# Patient Record
Sex: Female | Born: 1995 | Race: White | Hispanic: No | Marital: Single | Smoking: Never smoker
Health system: Southern US, Community
[De-identification: ages and names within clinical notes are randomized; demographics above are authoritative.]

## PROBLEM LIST (undated history)

## (undated) DIAGNOSIS — A6000 Herpesviral infection of urogenital system, unspecified: Secondary | ICD-10-CM

## (undated) HISTORY — DX: Herpesviral infection of urogenital system, unspecified: A60.00

## (undated) HISTORY — PX: NO PAST SURGERIES: SHX2092

---
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
## 2014-04-20 ENCOUNTER — Ambulatory Visit: Payer: Self-pay | Admitting: General Practice

## 2016-04-30 ENCOUNTER — Other Ambulatory Visit: Payer: Self-pay | Admitting: Obstetrics and Gynecology

## 2016-04-30 NOTE — Telephone Encounter (Signed)
Please sched. appt and send back to [facility].  Thanks.

## 2016-04-30 NOTE — Telephone Encounter (Signed)
Pt needs to sched annual and will RF till appt. RN to communicate this with pt.

## 2016-05-22 NOTE — Telephone Encounter (Signed)
msg left

## 2016-09-03 ENCOUNTER — Encounter: Payer: Self-pay | Admitting: Obstetrics and Gynecology

## 2016-09-20 IMAGING — CR RIGHT ANKLE - COMPLETE 3+ VIEW
1 series · 3 of 3 positions shown · non-contrast
Comparison: None.

CLINICAL DATA: Jamming injury. Pain and swelling . Initial
evaluation.

EXAM:
RIGHT ANKLE - COMPLETE 3+ VIEW

[Series 1: dxr ankle right complete · 0.14mm/px · 3 of 3 slices shown]
[im 1/3]
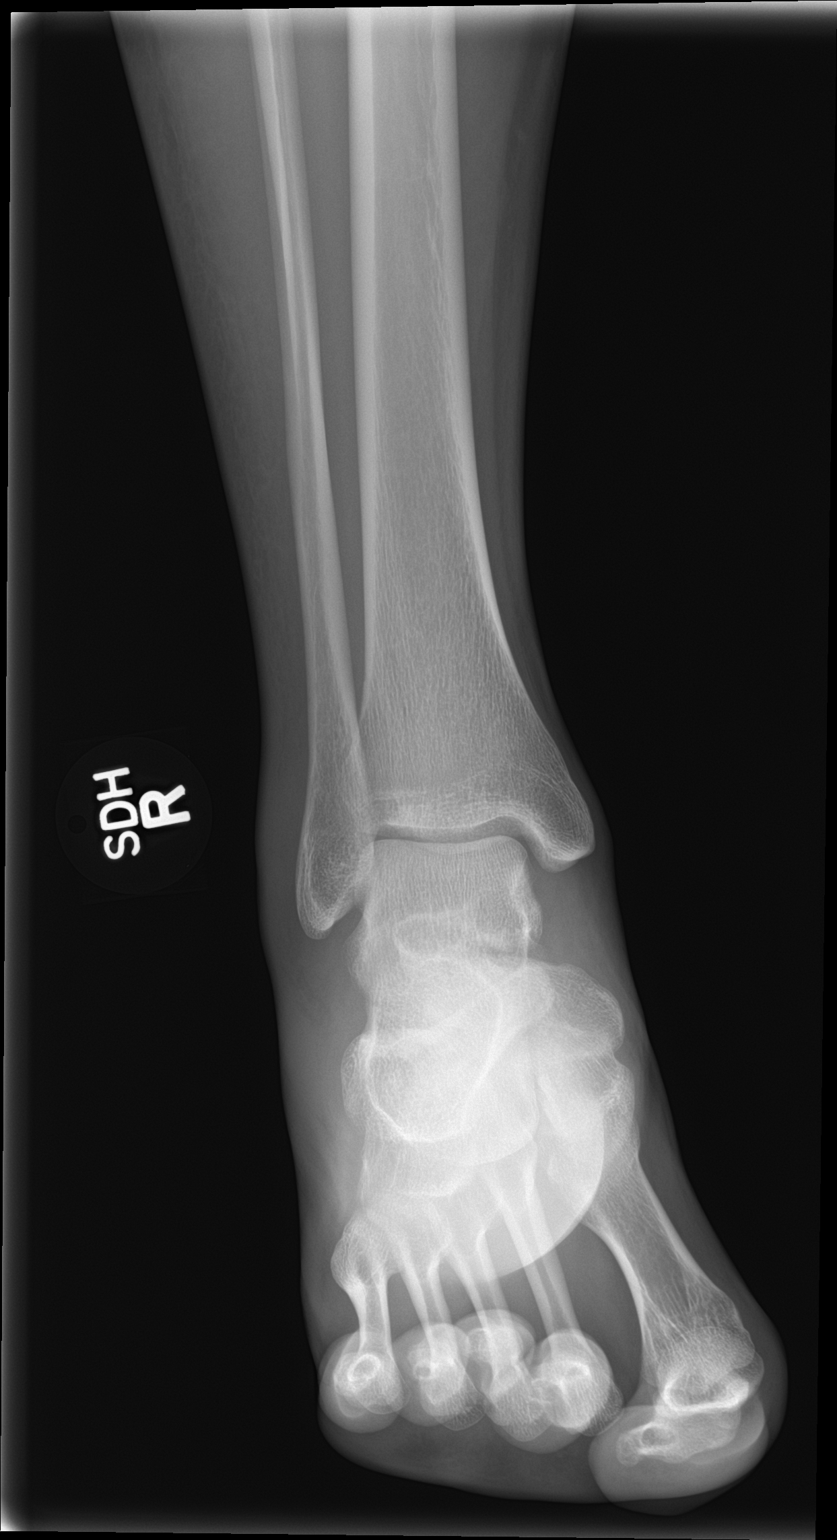
[im 2/3]
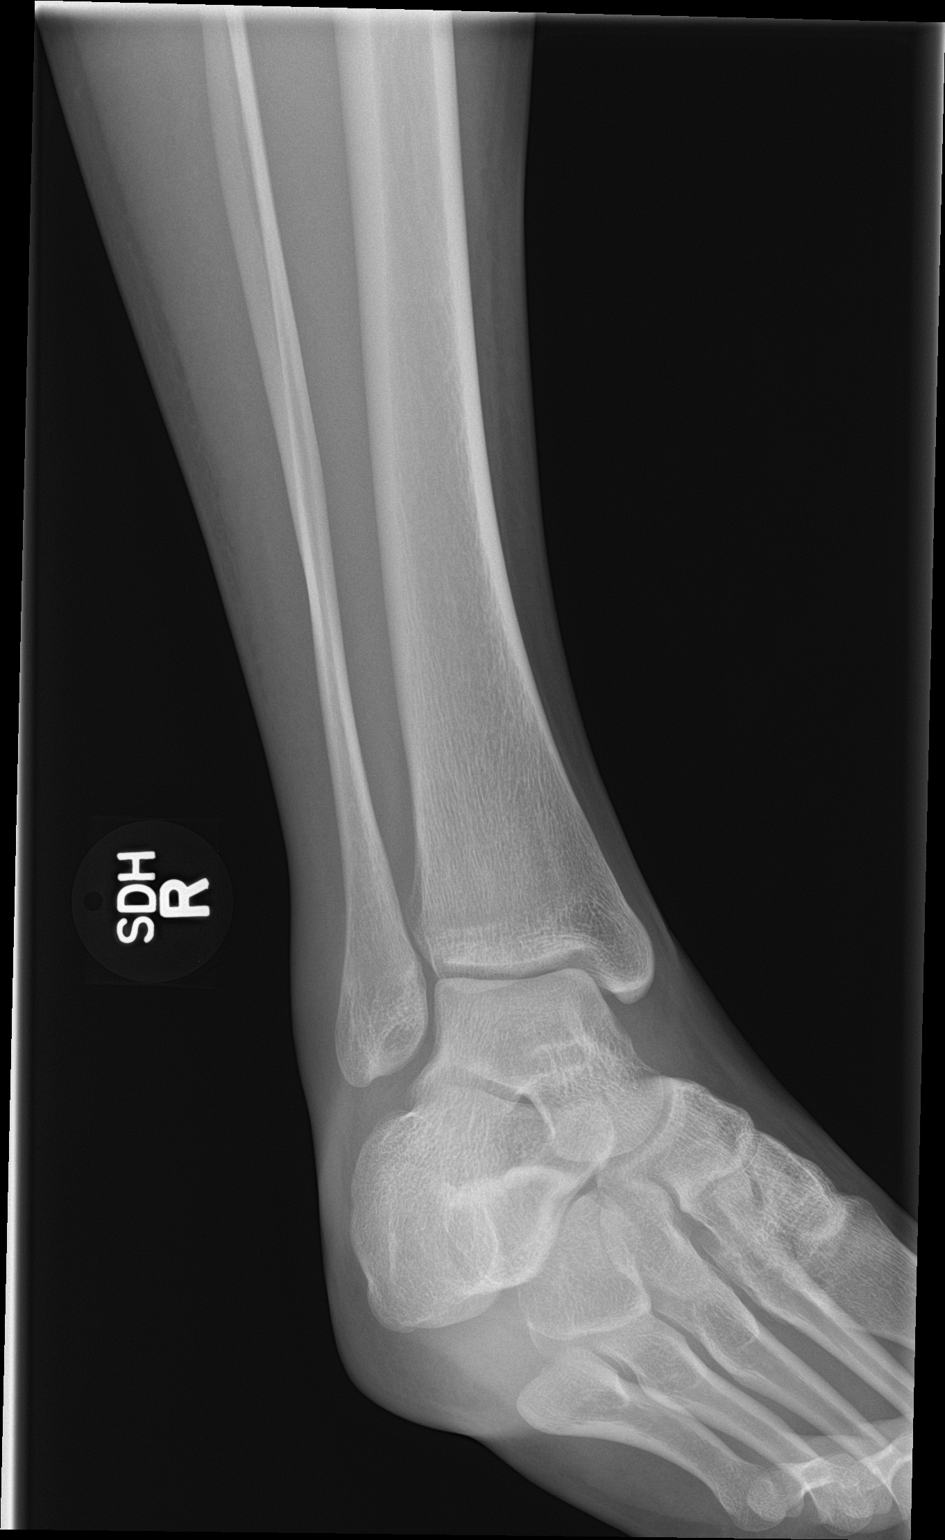
[im 3/3]
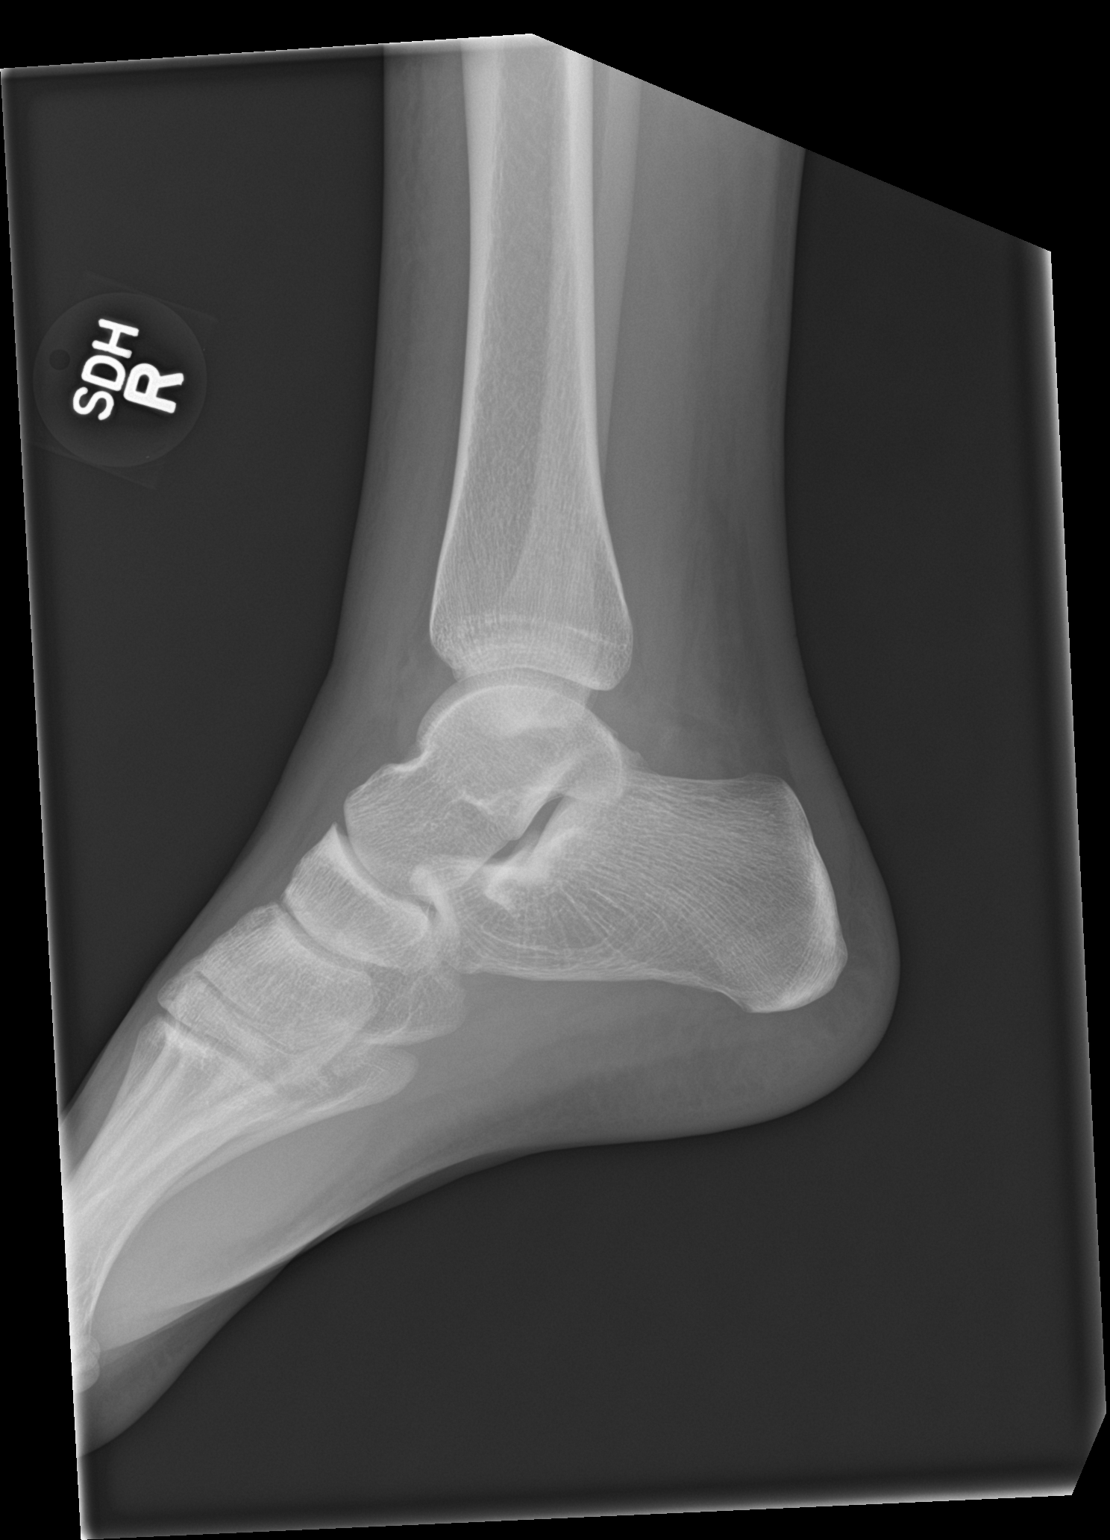

[3 of 3 positions shown; findings below may reference images not displayed]

FINDINGS: Soft tissue swelling is noted over the lateral malleolus. No
evidence of fracture or dislocation. No acute bony abnormality
identified.
IMPRESSION: Soft tissue swelling noted over the lateral malleolus. No acute bony
abnormality.

## 2016-10-09 ENCOUNTER — Telehealth: Payer: Self-pay

## 2016-10-09 MED ORDER — VALACYCLOVIR HCL 500 MG PO TABS: 500.0000 mg | ORAL_TABLET | Freq: Every day | ORAL | 0 refills | Status: DC

## 2016-10-09 NOTE — Telephone Encounter (Signed)
Pt aware.

## 2016-10-09 NOTE — Telephone Encounter (Signed)
[name] , [facility] [address] [name] is returning [facility]

## 2016-10-09 NOTE — Telephone Encounter (Signed)
Pt calling for refill of rx.  She also requested it online c no response.  Has appt Wed.  [phone].  Left detailed msg to call c name of medication and verify pharm.  We had responded to email for same information.

## 2016-10-14 ENCOUNTER — Encounter: Payer: Self-pay | Admitting: Obstetrics and Gynecology

## 2016-10-14 ENCOUNTER — Ambulatory Visit (INDEPENDENT_AMBULATORY_CARE_PROVIDER_SITE_OTHER): Payer: BLUE CROSS/BLUE SHIELD | Admitting: Obstetrics and Gynecology

## 2016-10-14 VITALS — BP 98/58 | HR 71 | Ht 68.0 in | Wt 137.0 lb

## 2016-10-14 DIAGNOSIS — A6004 Herpesviral vulvovaginitis: Secondary | ICD-10-CM | POA: Diagnosis not present

## 2016-10-14 DIAGNOSIS — Z113 Encounter for screening for infections with a predominantly sexual mode of transmission: Secondary | ICD-10-CM | POA: Diagnosis not present

## 2016-10-14 DIAGNOSIS — Z01419 Encounter for gynecological examination (general) (routine) without abnormal findings: Secondary | ICD-10-CM

## 2016-10-14 DIAGNOSIS — Z124 Encounter for screening for malignant neoplasm of cervix: Secondary | ICD-10-CM

## 2016-10-14 MED ORDER — VALACYCLOVIR HCL 500 MG PO TABS: 500.0000 mg | ORAL_TABLET | Freq: Every day | ORAL | 3 refills | Status: DC

## 2016-10-14 NOTE — Progress Notes (Addendum)
PCP:  Patient, No Pcp Per   Chief Complaint  Patient presents with  . Gynecologic Exam     HPI:      Rachael Sanders is a 21 y.o. G0P0000 who LMP was Patient's last menstrual period was 09/29/2016 (exact date)., presents today for her annual examination.  Her menses are regular every 28-30 days, lasting 5 days.  Dysmenorrhea mild, occurring first 1-2 days of flow. She does not have intermenstrual bleeding.  Sex activity: infrequently; single partner, contraception - condoms most of the time.  Declines other BC. Hx of STDs: HSV; takes valtrex daily   There is a FH of breast cancer in her MGM, genetic testing not indicated. There is no FH of ovarian cancer. The patient does not do self-breast exams.  Tobacco use: The patient denies current or previous tobacco use. Alcohol use: none No drug use.  Exercise: moderately active  She does get adequate calcium and Vitamin D in her diet.  Since her last annual GYN exam, she has not had any significant changes in her health history.   Gardasil series not done and pt not interested.    Past Medical History:  Diagnosis Date  . 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 NO PAST SURGERIES      Family History  Problem Relation Age of Onset  . Breast cancer Maternal Grandmother 80  . Cancer Neg Hx   . Diabetes Neg Hx   . Hyperlipidemia Neg Hx   . Hypertension Neg Hx   . Stroke Neg Hx   . Thyroid disease Neg Hx     Social History   Social History  . Marital status: Single    Spouse name: N/A  . Number of children: N/A  . Years of education: N/A   Occupational History  . Not on file.   Social History Main Topics  . Smoking status: Never Smoker  . Smokeless tobacco: Never Used  . Alcohol use No  . Drug use: No  . Sexual activity: Yes    Birth control/ protection: Condom   Other Topics Concern  . Not on file   Social History Narrative  . No  narrative on file    Current Meds  Medication Sig  . valACYclovir (VALTREX) 500 MG tablet Take 1 tablet (500 mg total) by mouth daily.  . [DISCONTINUED] valACYclovir (VALTREX) 500 MG tablet Take 1 tablet (500 mg total) by mouth daily.     ROS:  Review of Systems  Constitutional: Negative for fatigue, fever and unexpected weight change.  Respiratory: Negative for cough, shortness of breath and wheezing.   Cardiovascular: Negative for chest pain, palpitations and leg swelling.  Gastrointestinal: Negative for blood in stool, constipation, diarrhea, nausea and vomiting.  Endocrine: Negative for cold intolerance, heat intolerance and polyuria.  Genitourinary: Negative for dyspareunia, dysuria, flank pain, frequency, genital sores, hematuria, menstrual problem, pelvic pain, urgency, vaginal bleeding, vaginal discharge and vaginal pain.  Musculoskeletal: Negative for back pain, joint swelling and myalgias.  Skin: Negative for rash.  Neurological: Negative for dizziness, syncope, light-headedness, numbness and headaches.  Hematological: Negative for adenopathy.  Psychiatric/Behavioral: Negative for agitation, confusion, sleep disturbance and suicidal ideas. The patient is not nervous/anxious.      Objective: BP (!) 98/58   Pulse 71   Ht 5\' 8"  (1.727 m)   Wt 137 lb (62.1 kg)   LMP 09/29/2016 (Exact Date)   BMI  20.83 kg/m    Physical Exam  Constitutional: She is oriented to person, place, and time. She appears well-developed and well-nourished.  Genitourinary: Vagina normal and uterus normal. There is no rash or tenderness on the right labia. There is no rash or tenderness on the left labia. No erythema or tenderness in the vagina. No vaginal discharge found. Right adnexum does not display mass and does not display tenderness. Left adnexum does not display mass and does not display tenderness. Cervix does not exhibit motion tenderness or polyp. Uterus is not enlarged or tender.  Neck:  Normal range of motion. No thyromegaly present.  Cardiovascular: Normal rate, regular rhythm and normal heart sounds.   No murmur heard. Pulmonary/Chest: Effort normal and breath sounds normal. Right breast exhibits no mass, no nipple discharge, no skin change and no tenderness. Left breast exhibits no mass, no nipple discharge, no skin change and no tenderness.  Abdominal: Soft. There is no tenderness. There is no guarding.  Musculoskeletal: Normal range of motion.  Neurological: She is alert and oriented to person, place, and time. No cranial nerve deficit.  Psychiatric: She has a normal mood and affect. Her behavior is normal.  Vitals reviewed.   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  - Plan: valACYclovir (VALTREX) 500 MG tablet  Meds ordered this encounter  Medications  . valACYclovir (VALTREX) 500 MG tablet    Sig: Take 1 tablet (500 mg total) by mouth daily.    Dispense:  90 tablet    Refill:  3             GYN counsel adequate intake of calcium and vitamin D, diet and exercise     F/U  Return in about 1 year (around 10/14/2017).  Alicia B. Copland, PA-C 10/14/2016 10:28 AM

## 2016-10-16 LAB — IGP,CTNGTV,RFX APTIMA HPV ASCU
Chlamydia, Nuc. Acid Amp: NEGATIVE
Gonococcus, Nuc. Acid Amp: NEGATIVE
PAP SMEAR COMMENT: 0
Trich vag by NAA: NEGATIVE

## 2017-05-17 ENCOUNTER — Ambulatory Visit: Payer: BLUE CROSS/BLUE SHIELD | Admitting: Medical

## 2017-05-17 VITALS — BP 131/82 | HR 78 | Temp 99.1°F | Resp 16 | Ht 68.0 in | Wt 143.0 lb

## 2017-05-17 DIAGNOSIS — B349 Viral infection, unspecified: Secondary | ICD-10-CM

## 2017-05-17 LAB — POCT INFLUENZA A/B
Influenza A, POC: NEGATIVE
Influenza B, POC: NEGATIVE

## 2017-05-17 NOTE — Progress Notes (Signed)
   Subjective:    Patient ID: Rachael Sanders, female    DOB: 12/12/1995, 22 y.o.   MRN: [id number]  HPI 22 yo female in non acute distress started Saturday with nausea with temp 99.0, went to the gym at  2 pm, symptoms began about 7pm. Yesterday with clear nasal drainage weakness and did not "feel well at all" Temp of 99.1 and went to work, she works as a Conservation officer, naturecashier at [employer].  Tingling in Left hand on and off yesterday, patient is left handed.  Today temp of  99.2 took Advil 400mg  at  9:35am. Feels fine most of the day but feels hot.  Tingling in hand now resolved.  Aleve  Denies any chance she could be pregnant. Did not get flu vaccine Review of Systems  Constitutional: Positive for chills and fever.  HENT: Positive for congestion (little bit) and rhinorrhea (little bit). Negative for ear pain, postnasal drip, sinus pressure, sinus pain, sneezing, sore throat and tinnitus.   Eyes: Negative for discharge and itching.  Respiratory: Negative for cough and wheezing.   Cardiovascular: Negative for chest pain.  Gastrointestinal: Positive for nausea. Negative for abdominal pain.  Endocrine: Negative for polydipsia, polyphagia and polyuria.  Genitourinary: Negative for dysuria.  Musculoskeletal: Positive for myalgias (just from "working out").  Skin: Negative for rash.  Allergic/Immunologic: Positive for environmental allergies. Negative for food allergies.  Neurological: Positive for headaches. Negative for dizziness, speech difficulty and light-headedness.  Hematological: Negative for adenopathy.  Psychiatric/Behavioral: Negative for behavioral problems, self-injury and suicidal ideas. The patient is not nervous/anxious.        Objective:   Physical Exam  Constitutional: She is oriented to person, place, and time. She appears well-developed and well-nourished.  HENT:  Head: Normocephalic and atraumatic.  Right Ear: Hearing, external ear and ear canal normal. A  middle ear effusion is present.  Left Ear: Hearing, external ear and ear canal normal. A middle ear effusion is present.  Nose: Mucosal edema (right side) and rhinorrhea (right side) present.  Mouth/Throat: Uvula is midline, oropharynx is clear and moist and mucous membranes are normal.  Eyes: Pupils are equal, round, and reactive to light. Conjunctivae and EOM are normal.  Neck: Normal range of motion. Neck supple.  Cardiovascular: Normal rate, regular rhythm and normal heart sounds. Exam reveals no gallop and no friction rub.  No murmur heard. Pulmonary/Chest: Effort normal and breath sounds normal.  Neurological: She is alert and oriented to person, place, and time.  Skin: Skin is warm and dry.  Psychiatric: She has a normal mood and affect. Her behavior is normal. Judgment and thought content normal.  Nursing note and vitals reviewed.  Influenza test A/B Negative  Clear discharge right nostril     Assessment & Plan:  Viral illness Rest , increase fluids, OTC Advil take as directed. 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 Follow up in 3- 5 days if not improving. Fever of 100.5 or higher,  Or green disharge or cough or any other concerns to return to the clinic. Patient verbalizes understanding and has no questions at discharge.

## 2017-05-17 NOTE — Patient Instructions (Signed)
Viral Illness, Adult Viruses are tiny germs that can get into a person's body and cause illness. There are many different types of viruses, and they cause many types of illness. Viral illnesses can range from mild to severe. They can affect various parts of the body. Common illnesses that are caused by a virus include colds and the flu. Viral illnesses also include serious conditions such as HIV/AIDS (human immunodeficiency virus/acquired immunodeficiency syndrome). A few viruses have been linked to certain cancers. What are the causes? Many types of viruses can cause illness. Viruses invade cells in your body, multiply, and cause the infected cells to malfunction or die. When the cell dies, it releases more of the virus. When this happens, you develop symptoms of the illness, and the virus continues to spread to other cells. If the virus takes over the function of the cell, it can cause the cell to divide and grow out of control, as is the case when a virus causes cancer. Different viruses get into the body in different ways. You can get a virus by:  Swallowing food or water that is contaminated with the virus.  Breathing in droplets that have been coughed or sneezed into the air by an infected person.  Touching a surface that has been contaminated with the virus and then touching your eyes, nose, or mouth.  Being bitten by an insect or animal that carries the virus.  Having sexual contact with a person who is infected with the virus.  Being exposed to blood or fluids that contain the virus, either through an open cut or during a transfusion.  If a virus enters your body, your body's defense system (immune system) will try to fight the virus. You may be at higher risk for a viral illness if your immune system is weak. What are the signs or symptoms? Symptoms vary depending on the type of virus and the location of the cells that it invades. Common symptoms of the main types of viral illnesses  include: Cold and flu viruses  Fever.  Headache.  Sore throat.  Muscle aches.  Nasal congestion.  Cough. Digestive system (gastrointestinal) viruses  Fever.  Abdominal pain.  Nausea.  Diarrhea. Liver viruses (hepatitis)  Loss of appetite.  Tiredness.  Yellowing of the skin (jaundice). Brain and spinal cord viruses  Fever.  Headache.  Stiff neck.  Nausea and vomiting.  Confusion or sleepiness. Skin viruses  Warts.  Itching.  Rash. Sexually transmitted viruses  Discharge.  Swelling.  Redness.  Rash. How is this treated? Viruses can be difficult to treat because they live within cells. Antibiotic medicines do not treat viruses because these drugs do not get inside cells. Treatment for a viral illness may include:  Resting and drinking plenty of fluids.  Medicines to relieve symptoms. These can include over-the-counter medicine for pain and fever, medicines for cough or congestion, and medicines to relieve diarrhea.  Antiviral medicines. These drugs are available only for certain types of viruses. They may help reduce flu symptoms if taken early. There are also many antiviral medicines for hepatitis and HIV/AIDS.  Some viral illnesses can be prevented with vaccinations. A common example is the flu shot. Follow these instructions at home: Medicines   Take over-the-counter and prescription medicines only as told by your health care provider.  If you were prescribed an antiviral medicine, take it as told by your health care provider. Do not stop taking the medicine even if you start to feel better.  Be aware   of when antibiotics are needed and when they are not needed. Antibiotics do not treat viruses. If your health care provider thinks that you may have a bacterial infection as well as a viral infection, you may get an antibiotic. ? Do not ask for an antibiotic prescription if you have been diagnosed with a viral illness. That will not make your  illness go away faster. ? Frequently taking antibiotics when they are not needed can lead to antibiotic resistance. When this develops, the medicine no longer works against the bacteria that it normally fights. General instructions  Drink enough fluids to keep your urine clear or pale yellow.  Rest as much as possible.  Return to your normal activities as told by your health care provider. Ask your health care provider what activities are safe for you.  Keep all follow-up visits as told by your health care provider. This is important. How is this prevented? Take these actions to reduce your risk of viral infection:  Eat a healthy diet and get enough rest.  Wash your hands often with soap and water. This is especially important when you are in public places. If soap and water are not available, use hand sanitizer.  Avoid close contact with friends and family who have a viral illness.  If you travel to areas where viral gastrointestinal infection is common, avoid drinking water or eating raw food.  Keep your immunizations up to date. Get a flu shot every year as told by your health care provider.  Do not share toothbrushes, nail clippers, razors, or needles with other people.  Always practice safe sex.  Contact a health care provider if:  You have symptoms of a viral illness that do not go away.  Your symptoms come back after going away.  Your symptoms get worse. Get help right away if:  You have trouble breathing.  You have a severe headache or a stiff neck.  You have severe vomiting or abdominal pain. This information is not intended to replace advice given to you by your health care provider. Make sure you discuss any questions you have with your health care provider. Document Released: 06/07/2015 Document Revised: 07/10/2015 Document Reviewed: 06/07/2015 Elsevier Interactive Patient Education  2018 Elsevier Inc.  

## 2018-01-26 ENCOUNTER — Ambulatory Visit (INDEPENDENT_AMBULATORY_CARE_PROVIDER_SITE_OTHER): Payer: BLUE CROSS/BLUE SHIELD | Admitting: Obstetrics and Gynecology

## 2018-01-26 ENCOUNTER — Encounter: Payer: Self-pay | Admitting: Obstetrics and Gynecology

## 2018-01-26 ENCOUNTER — Other Ambulatory Visit (HOSPITAL_COMMUNITY)
Admission: RE | Admit: 2018-01-26 | Discharge: 2018-01-26 | Disposition: A | Discharge status: Home / Self Care | Payer: BLUE CROSS/BLUE SHIELD | Source: Ambulatory Visit | Attending: Obstetrics and Gynecology | Admitting: Obstetrics and Gynecology

## 2018-01-26 VITALS — BP 110/78 | HR 69 | Ht 68.0 in | Wt 140.0 lb

## 2018-01-26 DIAGNOSIS — Z113 Encounter for screening for infections with a predominantly sexual mode of transmission: Secondary | ICD-10-CM | POA: Diagnosis present

## 2018-01-26 DIAGNOSIS — Z01419 Encounter for gynecological examination (general) (routine) without abnormal findings: Secondary | ICD-10-CM | POA: Diagnosis not present

## 2018-01-26 DIAGNOSIS — A6004 Herpesviral vulvovaginitis: Secondary | ICD-10-CM

## 2018-01-26 DIAGNOSIS — Z124 Encounter for screening for malignant neoplasm of cervix: Secondary | ICD-10-CM

## 2018-01-26 MED ORDER — VALACYCLOVIR HCL 500 MG PO TABS: 500.0000 mg | ORAL_TABLET | Freq: Every day | ORAL | 3 refills | Status: DC

## 2018-01-26 NOTE — Patient Instructions (Signed)
I value your feedback and entrusting us with your care. If you get a Rachael Sanders patient survey, I would appreciate you taking the time to let us know about your experience today. Thank you! 

## 2018-01-26 NOTE — Progress Notes (Signed)
PCP:  Patient, No Pcp Per   Chief Complaint  Patient presents with  . Gynecologic Exam     HPI:      Rachael Sanders is a 22 y.o. G0P0000 who LMP was Patient's last menstrual period was 01/07/2018 (exact date)., presents today for her annual examination.  Her menses are regular every 28-30 days, lasting 5 days.  Dysmenorrhea mild, occurring first 1-2 days of flow. She does not have intermenstrual bleeding.  Sex activity: not currently; used condoms in past.  Last pap: 10/14/16  Results: normal Hx of STDs: HSV; takes valtrex daily   There is a FH of breast cancer in her MGM, genetic testing not indicated. There is no FH of ovarian cancer. The patient does not do self-breast exams.  Tobacco use: The patient denies current or previous tobacco use. Alcohol use: none No drug use.  Exercise: moderately active  She does get adequate calcium and Vitamin D in her diet.  Gardasil series not done and pt not interested.    Past Medical History:  Diagnosis Date  . 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 NO PAST SURGERIES      Family History  Problem Relation Age of Onset  . Breast cancer Maternal Grandmother 80  . Cancer Neg Hx   . Diabetes Neg Hx   . Hyperlipidemia Neg Hx   . Hypertension Neg Hx   . Stroke Neg Hx   . Thyroid disease Neg Hx     Social History   Socioeconomic History  . Marital status: Single    Spouse name: Not on file  . Number of children: Not on file  . Years of education: Not on file  . Highest education level: Not on file  Occupational History  . Not on file  Social Needs  . Financial resource strain: Not on file  . Food insecurity:    Worry: Not on file    Inability: Not on file  . Transportation needs:    Medical: Not on file    Non-medical: Not on file  Tobacco Use  . Smoking status: Never Smoker  . Smokeless tobacco: Never Used  Substance and Sexual Activity   . Alcohol use: No  . Drug use: No  . Sexual activity: Not Currently    Birth control/protection: Condom  Lifestyle  . Physical activity:    Days per week: Not on file    Minutes per session: Not on file  . Stress: Not on file  Relationships  . Social connections:    Talks on phone: Not on file    Gets together: Not on file    Attends religious service: Not on file    Active member of club or organization: Not on file    Attends meetings of clubs or organizations: Not on file    Relationship status: Not on file  . Intimate partner violence:    Fear of current or ex partner: Not on file    Emotionally abused: Not on file    Physically abused: Not on file    Forced sexual activity: Not on file  Other Topics Concern  . Not on file  Social History Narrative  . Not on file    Current Meds  Medication Sig  . valACYclovir (VALTREX) 500 MG tablet Take 1 tablet (500 mg total) by mouth daily.  . [DISCONTINUED] valACYclovir (VALTREX) 500 MG tablet Take 1  tablet (500 mg total) by mouth daily.     ROS:  Review of Systems  Constitutional: Negative for fatigue, fever and unexpected weight change.  Respiratory: Negative for cough, shortness of breath and wheezing.   Cardiovascular: Negative for chest pain, palpitations and leg swelling.  Gastrointestinal: Negative for blood in stool, constipation, diarrhea, nausea and vomiting.  Endocrine: Negative for cold intolerance, heat intolerance and polyuria.  Genitourinary: Negative for dyspareunia, dysuria, flank pain, frequency, genital sores, hematuria, menstrual problem, pelvic pain, urgency, vaginal bleeding, vaginal discharge and vaginal pain.  Musculoskeletal: Negative for back pain, joint swelling and myalgias.  Skin: Negative for rash.  Neurological: Negative for dizziness, syncope, light-headedness, numbness and headaches.  Hematological: Negative for adenopathy.  Psychiatric/Behavioral: Negative for agitation, confusion, sleep  disturbance and suicidal ideas. The patient is not nervous/anxious.      Objective: BP 110/78   Pulse 69   Ht 5\' 8"  (1.727 m)   Wt 140 lb (63.5 kg)   LMP 01/07/2018 (Exact Date)   BMI 21.29 kg/m    Physical Exam Constitutional:      Appearance: She is well-developed.  Genitourinary:     Vulva, vagina, uterus, right adnexa and left adnexa normal.     No vulval lesion or tenderness noted.     No vaginal discharge, erythema, tenderness or bleeding.     No cervical motion tenderness, discharge, friability or polyp.     Uterus is not enlarged or tender.     No right or left adnexal mass present.     Right adnexa not tender.     Left adnexa not tender.  Neck:     Musculoskeletal: Normal range of motion.     Thyroid: No thyromegaly.  Cardiovascular:     Rate and Rhythm: Normal rate and regular rhythm.     Heart sounds: Normal heart sounds. No murmur.  Pulmonary:     Effort: Pulmonary effort is normal.     Breath sounds: Normal breath sounds.  Chest:     Breasts:        Right: No mass, nipple discharge, skin change or tenderness.        Left: No mass, nipple discharge, skin change or tenderness.  Abdominal:     Palpations: Abdomen is soft.     Tenderness: There is no abdominal tenderness. There is no guarding.  Musculoskeletal: Normal range of motion.  Neurological:     Mental Status: She is alert and oriented to person, place, and time.     Cranial Nerves: No cranial nerve deficit.  Psychiatric:        Behavior: Behavior normal.  Vitals signs and nursing note reviewed.     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  - Plan: valACYclovir (VALTREX) 500 MG tablet  Meds ordered this encounter  Medications  . valACYclovir (VALTREX) 500 MG tablet    Sig: Take 1 tablet  (500 mg total) by mouth daily.    Dispense:  90 tablet    Refill:  3    Order Specific Question:   Supervising Provider    Answer:   Nadara Mustard [[id number]]             GYN counsel adequate intake of calcium and vitamin D, diet and exercise     F/U  Return in about 1 year (  around 01/27/2019).  Rik Wadel B. Keyaria Lawson, PA-C 01/26/2018 11:28 AM

## 2018-01-27 LAB — CYTOLOGY - PAP
Chlamydia: NEGATIVE
Diagnosis: NEGATIVE
NEISSERIA GONORRHEA: NEGATIVE

## 2018-09-06 ENCOUNTER — Telehealth: Payer: Self-pay

## 2018-09-06 NOTE — Telephone Encounter (Signed)
Pt calling; has paper work for physical that needs to be filled out for her new job.  They need by next Wed.  Can she just drop it off or does she need an appt.  [phone]

## 2018-09-07 NOTE — Telephone Encounter (Signed)
Called pt, no answer, left detailed msg, she can fax it or drop it off. Advised to call before entering building if she decides to drop it off.

## 2018-09-12 ENCOUNTER — Ambulatory Visit (LOCAL_COMMUNITY_HEALTH_CENTER): Payer: BC Managed Care – PPO

## 2018-09-12 ENCOUNTER — Other Ambulatory Visit: Payer: Self-pay

## 2018-09-12 DIAGNOSIS — Z111 Encounter for screening for respiratory tuberculosis: Secondary | ICD-10-CM

## 2018-09-15 ENCOUNTER — Ambulatory Visit (LOCAL_COMMUNITY_HEALTH_CENTER): Payer: BC Managed Care – PPO

## 2018-09-15 ENCOUNTER — Other Ambulatory Visit: Payer: Self-pay

## 2018-09-15 DIAGNOSIS — Z111 Encounter for screening for respiratory tuberculosis: Secondary | ICD-10-CM

## 2018-09-15 LAB — TB SKIN TEST
Induration: 0 mm
TB Skin Test: NEGATIVE

## 2018-10-05 NOTE — Telephone Encounter (Signed)
Pt is calling to ask if physical form was filled out. I told her it was NOT due to [facility] being out of the office when she brought it. I told her by the time [facility] returned from vacation, the date she needed that form by had passed, and I called her to ask her if she needed it anymore but I couldn't get in touch with her. She says she still needs that form filled out so she will drop it off again sometime tomorrow.

## 2018-10-13 NOTE — Telephone Encounter (Signed)
Called pt, no answer, left msg that her medical report has been filled out by [facility] and is ready for pick up.

## 2019-02-14 ENCOUNTER — Ambulatory Visit (INDEPENDENT_AMBULATORY_CARE_PROVIDER_SITE_OTHER): Payer: BC Managed Care – PPO | Admitting: Obstetrics and Gynecology

## 2019-02-14 ENCOUNTER — Other Ambulatory Visit (HOSPITAL_COMMUNITY)
Admission: RE | Admit: 2019-02-14 | Discharge: 2019-02-14 | Disposition: A | Discharge status: Home / Self Care | Payer: BC Managed Care – PPO | Source: Ambulatory Visit | Attending: Obstetrics and Gynecology | Admitting: Obstetrics and Gynecology

## 2019-02-14 ENCOUNTER — Other Ambulatory Visit: Payer: Self-pay

## 2019-02-14 ENCOUNTER — Encounter: Payer: Self-pay | Admitting: Obstetrics and Gynecology

## 2019-02-14 VITALS — BP 120/90 | Ht 67.0 in | Wt 137.0 lb

## 2019-02-14 DIAGNOSIS — Z113 Encounter for screening for infections with a predominantly sexual mode of transmission: Secondary | ICD-10-CM | POA: Insufficient documentation

## 2019-02-14 DIAGNOSIS — Z01419 Encounter for gynecological examination (general) (routine) without abnormal findings: Secondary | ICD-10-CM

## 2019-02-14 DIAGNOSIS — A6004 Herpesviral vulvovaginitis: Secondary | ICD-10-CM

## 2019-02-14 DIAGNOSIS — Z23 Encounter for immunization: Secondary | ICD-10-CM | POA: Diagnosis not present

## 2019-02-14 MED ORDER — VALACYCLOVIR HCL 500 MG PO TABS: 500.0000 mg | ORAL_TABLET | Freq: Every day | ORAL | 3 refills | Status: DC

## 2019-02-14 NOTE — Progress Notes (Signed)
PCP:  Patient, No Pcp Per   Chief Complaint  Patient presents with  . Gynecologic Exam    flu shot      HPI:      Rachael Sanders is a 24 y.o. G0P0000 who LMP was Patient's last menstrual period was 01/17/2019 (approximate)., presents today for her annual examination.  Her menses are regular every 28-30 days, lasting 5-7 days.  Dysmenorrhea mild, occurring first 1-2 days of flow. She does not have intermenstrual bleeding.  Sex activity: currently sex active, using condoms. Declines other BC.  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 No recent outbreaks  There is a FH of breast cancer in her MGM, genetic testing not indicated. There is no FH of ovarian cancer. The patient does not do self-breast exams.  Tobacco use: The patient denies current or previous tobacco use. Alcohol use: none No drug use.  Exercise: moderately active  She does get adequate calcium but not Vitamin D in her diet.  Gardasil series not done and pt not interested.    Past Medical History:  Diagnosis Date  . 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 NO PAST SURGERIES      Family History  Problem Relation Age of Onset  . Breast cancer Maternal Grandmother 80  . Cancer Neg Hx   . Diabetes Neg Hx   . Hyperlipidemia Neg Hx   . Hypertension Neg Hx   . Stroke Neg Hx   . Thyroid disease Neg Hx     Social History   Socioeconomic History  . Marital status: Single    Spouse name: Not on file  . Number of children: Not on file  . Years of education: Not on file  . Highest education level: Not on file  Occupational History  . Not on file  Tobacco Use  . Smoking status: Never Smoker  . Smokeless tobacco: Never Used  Substance and Sexual Activity  . Alcohol use: No  . Drug use: No  . Sexual activity: Yes    Birth control/protection: Condom  Other  Topics Concern  . Not on file  Social History Narrative  . Not on file   Social Determinants of Health   Financial Resource Strain:   . Difficulty of Paying Living Expenses: Not on file  Food Insecurity:   . Worried About Programme researcher, broadcasting/film/video in the Last Year: Not on file  . Ran Out of Food in the Last Year: Not on file  Transportation Needs:   . Lack of Transportation (Medical): Not on file  . Lack of Transportation (Non-Medical): Not on file  Physical Activity:   . Days of Exercise per Week: Not on file  . Minutes of Exercise per Session: Not on file  Stress:   . Feeling of Stress : Not on file  Social Connections:   . Frequency of Communication with Friends and Family: Not on file  . Frequency of Social Gatherings with Friends and Family: Not on file  . Attends Religious Services: Not on file  . Active Member of Clubs or Organizations: Not on file  . Attends Banker Meetings: Not on file  . Marital Status: Not on file  Intimate Partner Violence:   . Fear of Current or Ex-Partner: Not on file  . Emotionally Abused: Not on file  .  Physically Abused: Not on file  . Sexually Abused: Not on file    Current Meds  Medication Sig  . valACYclovir (VALTREX) 500 MG tablet Take 1 tablet (500 mg total) by mouth daily.  . [DISCONTINUED] valACYclovir (VALTREX) 500 MG tablet Take 1 tablet (500 mg total) by mouth daily.     ROS:  Review of Systems  Constitutional: Negative for fatigue, fever and unexpected weight change.  Respiratory: Negative for cough, shortness of breath and wheezing.   Cardiovascular: Negative for chest pain, palpitations and leg swelling.  Gastrointestinal: Negative for blood in stool, constipation, diarrhea, nausea and vomiting.  Endocrine: Negative for cold intolerance, heat intolerance and polyuria.  Genitourinary: Negative for dyspareunia, dysuria, flank pain, frequency, genital sores, hematuria, menstrual problem, pelvic pain, urgency, vaginal  bleeding, vaginal discharge and vaginal pain.  Musculoskeletal: Negative for back pain, joint swelling and myalgias.  Skin: Negative for rash.  Neurological: Negative for dizziness, syncope, light-headedness, numbness and headaches.  Hematological: Negative for adenopathy.  Psychiatric/Behavioral: Negative for agitation, confusion, sleep disturbance and suicidal ideas. The patient is not nervous/anxious.      Objective: BP 120/90   Ht 5\' 7"  (1.702 m)   Wt 137 lb (62.1 kg)   LMP 01/17/2019 (Approximate)   BMI 21.46 kg/m    Physical Exam Constitutional:      Appearance: She is well-developed.  Genitourinary:     Vulva, vagina, uterus, right adnexa and left adnexa normal.     No vulval lesion or tenderness noted.     No vaginal discharge, erythema, tenderness or bleeding.     No cervical motion tenderness, discharge, friability or polyp.     Uterus is not enlarged or tender.     No right or left adnexal mass present.     Right adnexa not tender.     Left adnexa not tender.  Neck:     Thyroid: No thyromegaly.  Cardiovascular:     Rate and Rhythm: Normal rate and regular rhythm.     Heart sounds: Normal heart sounds. No murmur.  Pulmonary:     Effort: Pulmonary effort is normal.     Breath sounds: Normal breath sounds.  Chest:     Breasts:        Right: No mass, nipple discharge, skin change or tenderness.        Left: No mass, nipple discharge, skin change or tenderness.  Abdominal:     Palpations: Abdomen is soft.     Tenderness: There is no abdominal tenderness. There is no guarding.  Musculoskeletal:        General: Normal range of motion.     Cervical back: Normal range of motion.  Neurological:     General: No focal deficit present.     Mental Status: She is alert and oriented to person, place, and time.     Cranial Nerves: No cranial nerve deficit.  Skin:    General: Skin is warm and dry.  Psychiatric:        Mood and Affect: Mood normal.        Behavior:  Behavior normal.        Thought Content: Thought content normal.        Judgment: Judgment normal.  Vitals and nursing note reviewed.     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  Suggested pt take daily to prevent being contagious since sex active, unless current  partner has hx of HSV 2 as well.- Plan: valACYclovir (VALTREX) 500 MG tablet   Meds ordered this encounter  Medications  . valACYclovir (VALTREX) 500 MG tablet    Sig: Take 1 tablet (500 mg total) by mouth daily.    Dispense:  90 tablet    Refill:  3    Order Specific Question:   Supervising Provider    Answer:   Gae Dry [[id number]]             GYN counsel adequate intake of calcium and vitamin D, diet and exercise     F/U  Return in about 1 year (around 02/14/2020).  Aniaya Bacha B. Vayla Wilhelmi, PA-C 02/14/2019 11:31 AM

## 2019-02-14 NOTE — Patient Instructions (Signed)
I value your feedback and entrusting us with your care. If you get a [facility] patient survey, I would appreciate you taking the time to let us know about your experience today. Thank you!  As of January 19, 2019, your lab results will be released to your MyChart immediately, before I even have a chance to see them. Please give me time to review them and contact you if there are any abnormalities. Thank you for your patience.  

## 2019-02-14 NOTE — Addendum Note (Signed)
Addended by: Donnetta Hail on: 02/14/2019 11:43 AM   Modules accepted: Orders

## 2019-02-16 LAB — CERVICOVAGINAL ANCILLARY ONLY
Chlamydia: NEGATIVE
Comment: NEGATIVE
Comment: NORMAL
Neisseria Gonorrhea: NEGATIVE

## 2019-07-24 ENCOUNTER — Other Ambulatory Visit: Payer: Self-pay

## 2019-07-24 ENCOUNTER — Encounter: Payer: Self-pay | Admitting: Medical

## 2019-07-24 ENCOUNTER — Telehealth: Payer: BC Managed Care – PPO | Admitting: Medical

## 2019-07-24 DIAGNOSIS — J019 Acute sinusitis, unspecified: Secondary | ICD-10-CM

## 2019-07-24 NOTE — Progress Notes (Signed)
° °  Subjective:    Patient ID: Rachael Sanders, female    DOB: 1996-01-02, 24 y.o.   MRN: [id number]  HPI 24 yo female non acute distress gives permission for telemedicine visit. Dry cough, with post nasal drip,  nasal congestion, clear discharge, with  blood one day and then some green with the clear today since 07/18/2012.  Thought it was allergies taking Claritin daily started  07/21/2019.First day with sore throat  But none now.  Took temprrature  98.5 on 07/21/19. Headache started  07/21/19 and continues daily upon waking, taking Tylenol 8hr with relief. Also tried nasal decongestant but not sure of the name of medication.  Woke up again today  with cough and headache and congestion in nose and head. No respiratory congestion.  Has not had Covid-19 virus.  Review of Systems  Constitutional: Negative for chills and fever.  HENT: Positive for congestion, postnasal drip, rhinorrhea and sore throat (initially). Negative for nosebleeds.   Respiratory: Positive for cough (dry). Negative for shortness of breath.   Cardiovascular: Negative for chest pain.  Gastrointestinal: Negative for abdominal pain and diarrhea.  Genitourinary: Negative for dysuria.  Neurological: Positive for headaches. Negative for dizziness, syncope, weakness and light-headedness.  Psychiatric/Behavioral: Negative for self-injury and suicidal ideas.  All other systems reviewed and are negative.      Objective:   Physical Exam No cough noted on phone call. No physical exam telemedicine call.       Assessment & Plan:  Cough, Headache, runny nose.perhaps with sinusitis. Recommended  Covid-19  Testing  at [facility], [address] [place]. She is going now, should be able to get test result back today. Patient to call me with results. Quick Covid-19 test negative , Pending PCR Covid-19 test. I will call patient at  5pm and get her results. Reviewed how to take Tylenol per package  instructions. Rest, increase fluids. Not to go to work till results are received, briefly went over the isolation period if positive, will review again once I have Covid-19 results. Patient verbalizes understanding and hs no questions at discharge.

## 2019-07-25 MED ORDER — AMOXICILLIN-POT CLAVULANATE 875-125 MG PO TABS: 1.0000 | ORAL_TABLET | Freq: Two times a day (BID) | ORAL | 0 refills | Status: DC

## 2019-07-25 NOTE — Addendum Note (Signed)
Addended by: Ellie Lunch R on: 07/25/2019 08:24 AM   Modules accepted: Orders

## 2020-03-03 ENCOUNTER — Other Ambulatory Visit: Payer: Self-pay | Admitting: Obstetrics and Gynecology

## 2020-03-03 DIAGNOSIS — A6004 Herpesviral vulvovaginitis: Secondary | ICD-10-CM

## 2020-03-14 ENCOUNTER — Telehealth: Payer: Self-pay

## 2020-03-14 NOTE — Telephone Encounter (Signed)
Pt calling for refill of bc.  [phone]

## 2020-03-15 NOTE — Telephone Encounter (Signed)
We have not put pt on OCPs. Past due for annual. Can sched annual and RF meds till then (need to know name of pill).

## 2020-03-18 ENCOUNTER — Other Ambulatory Visit: Payer: Self-pay | Admitting: Obstetrics and Gynecology

## 2020-03-18 DIAGNOSIS — A6004 Herpesviral vulvovaginitis: Secondary | ICD-10-CM

## 2020-03-18 MED ORDER — VALACYCLOVIR HCL 500 MG PO TABS: 500.0000 mg | ORAL_TABLET | Freq: Every day | ORAL | 0 refills | Status: DC

## 2020-03-18 NOTE — Telephone Encounter (Signed)
Called pt back, says she needs RF on valtrex, not BC (shes not on [facility]). Annual 3/3

## 2020-03-18 NOTE — Telephone Encounter (Signed)
Pt aware.

## 2020-03-18 NOTE — Progress Notes (Signed)
Rx RF valtrex till 3/22 annual

## 2020-03-18 NOTE — Telephone Encounter (Signed)
Rx valtrex RF eRxd. Pls notify pt.

## 2020-04-10 NOTE — Progress Notes (Signed)
PCP:  Patient, No Pcp Per   Chief Complaint  Patient presents with   Gynecologic Exam    No concerns   Injections    Flu     HPI:      [name] is a 25 y.o. G0P0000 who LMP was Patient's last menstrual period was 03/29/2020., presents today for her annual examination.  Her menses are regular every 28-30 days, lasting 5-7 days.  Dysmenorrhea mild, occurring first 1-2 days of flow. She does not have intermenstrual bleeding.  Sex activity: not sex active since last yr; used to use condoms.  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 No recent outbreaks. Doesn't need RF currently.  There is a FH of breast cancer in her MGM, genetic testing not indicated. There is no FH of ovarian cancer. The patient does not do self-breast exams.  Tobacco use: The patient denies current or previous tobacco use. Alcohol use: none No drug use.  Exercise: very active  She does get adequate calcium and Vitamin D in her diet.  Gardasil series not done and pt not interested.    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    Past Surgical History:  Procedure Laterality Date   NO PAST SURGERIES      Family History  Problem Relation Age of Onset   Breast cancer Maternal Grandmother 53   Cancer Neg Hx    Diabetes Neg Hx    Hyperlipidemia Neg Hx    Hypertension Neg Hx    Stroke Neg Hx    Thyroid disease Neg Hx     Social History   Socioeconomic History   Marital status: Single    Spouse name: Not on file   Number of children: Not on file   Years of education: Not on file   Highest education level: Not on file  Occupational History   Not on file  Tobacco Use   Smoking status: Never Smoker   Smokeless tobacco: Never Used  Vaping Use   Vaping Use: Never used  Substance and Sexual Activity   Alcohol use: No   Drug use: No   Sexual activity: Not  Currently    Birth control/protection: Condom  Other Topics Concern   Not on file  Social History Narrative   Not on file   Social Determinants of Health   Financial Resource Strain: Not on file  Food Insecurity: Not on file  Transportation Needs: Not on file  Physical Activity: Not on file  Stress: Not on file  Social Connections: Not on file  Intimate Partner Violence: Not on file    Current Meds  Medication Sig   valACYclovir (VALTREX) 500 MG tablet Take 1 tablet (500 mg total) by mouth daily.     ROS:  Review of Systems  Constitutional: Negative for fatigue, fever and unexpected weight change.  Respiratory: Negative for cough, shortness of breath and wheezing.   Cardiovascular: Negative for chest pain, palpitations and leg swelling.  Gastrointestinal: Negative for blood in stool, constipation, diarrhea, nausea and vomiting.  Endocrine: Negative for cold intolerance, heat intolerance and polyuria.  Genitourinary: Negative for dyspareunia, dysuria, flank pain, frequency, genital sores, hematuria, menstrual problem, pelvic pain, urgency, vaginal bleeding, vaginal discharge and vaginal pain.  Musculoskeletal: Negative for back pain, joint swelling and myalgias.  Skin: Negative for rash.  Neurological: Negative for dizziness, syncope, light-headedness, numbness and headaches.  Hematological: Negative for adenopathy.  Psychiatric/Behavioral: Negative for agitation, confusion, sleep disturbance and suicidal ideas. The patient is not nervous/anxious.      Objective: BP 100/70    Ht 5\' 8"  (1.727 m)    Wt 141 lb (64 kg)    LMP 03/29/2020    BMI 21.44 kg/m    Physical Exam Constitutional:      Appearance: She is well-developed.  Genitourinary:     Vulva normal.     Right Labia: No rash, tenderness or lesions.    Left Labia: No tenderness, lesions or rash.    No vaginal discharge, erythema, tenderness or bleeding.      Right Adnexa: not tender and no mass present.     Left Adnexa: not tender and no mass present.    No cervical motion tenderness, discharge, friability or polyp.     Uterus is not enlarged or tender.  Breasts:     Right: No mass, nipple discharge, skin change or tenderness.     Left: No mass, nipple discharge, skin change or tenderness.    Neck:     Thyroid: No thyromegaly.  Cardiovascular:     Rate and Rhythm: Normal rate and regular rhythm.     Heart sounds: Normal heart sounds. No murmur heard.   Pulmonary:     Effort: Pulmonary effort is normal.     Breath sounds: Normal breath sounds.  Abdominal:     Palpations: Abdomen is soft.     Tenderness: There is no abdominal tenderness. There is no guarding or rebound.  Musculoskeletal:        General: Normal range of motion.     Cervical back: Normal range of motion.  Lymphadenopathy:     Cervical: No cervical adenopathy.  Neurological:     General: No focal deficit present.     Mental Status: She is alert and oriented to person, place, and time.     Cranial Nerves: No cranial nerve deficit.  Skin:    General: Skin is warm and dry.  Psychiatric:        Mood and Affect: Mood normal.        Behavior: Behavior normal.        Thought Content: Thought content normal.        Judgment: Judgment normal.  Vitals and nursing note reviewed.     Assessment/Plan: Encounter for annual routine gynecological examination  Herpes simplex vulvovaginitis--will RF valtrex prn.      GYN counsel adequate intake of calcium and vitamin D, diet and exercise     F/U  Return in about 1 year (around 04/11/2021).  Eliot Popper B. Baelynn Schmuhl, PA-C 04/11/2020 10:18 AM

## 2020-04-10 NOTE — Patient Instructions (Signed)
I value your feedback and you entrusting us with your care. If you get a [facility] patient survey, I would appreciate you taking the time to let us know about your experience today. Thank you! ? ? ?

## 2020-04-11 ENCOUNTER — Other Ambulatory Visit: Payer: Self-pay

## 2020-04-11 ENCOUNTER — Encounter: Payer: Self-pay | Admitting: Obstetrics and Gynecology

## 2020-04-11 ENCOUNTER — Ambulatory Visit (INDEPENDENT_AMBULATORY_CARE_PROVIDER_SITE_OTHER): Payer: BC Managed Care – PPO | Admitting: Obstetrics and Gynecology

## 2020-04-11 VITALS — BP 100/70 | Ht 68.0 in | Wt 141.0 lb

## 2020-04-11 DIAGNOSIS — Z01419 Encounter for gynecological examination (general) (routine) without abnormal findings: Secondary | ICD-10-CM

## 2020-04-11 DIAGNOSIS — A6004 Herpesviral vulvovaginitis: Secondary | ICD-10-CM | POA: Insufficient documentation

## 2020-04-11 DIAGNOSIS — Z113 Encounter for screening for infections with a predominantly sexual mode of transmission: Secondary | ICD-10-CM

## 2020-04-11 DIAGNOSIS — Z23 Encounter for immunization: Secondary | ICD-10-CM

## 2020-06-11 ENCOUNTER — Other Ambulatory Visit: Payer: Self-pay | Admitting: Obstetrics and Gynecology

## 2020-06-11 DIAGNOSIS — A6004 Herpesviral vulvovaginitis: Secondary | ICD-10-CM

## 2020-06-12 ENCOUNTER — Other Ambulatory Visit: Payer: Self-pay | Admitting: Obstetrics and Gynecology

## 2020-06-12 DIAGNOSIS — A6004 Herpesviral vulvovaginitis: Secondary | ICD-10-CM

## 2020-06-14 ENCOUNTER — Other Ambulatory Visit: Payer: Self-pay | Admitting: Obstetrics and Gynecology

## 2020-06-14 DIAGNOSIS — A6004 Herpesviral vulvovaginitis: Secondary | ICD-10-CM

## 2020-06-14 MED ORDER — VALACYCLOVIR HCL 500 MG PO TABS: 500.0000 mg | ORAL_TABLET | Freq: Every day | ORAL | 3 refills | Status: DC

## 2021-04-14 ENCOUNTER — Encounter: Payer: Self-pay | Admitting: Obstetrics and Gynecology

## 2021-04-14 ENCOUNTER — Other Ambulatory Visit: Payer: Self-pay

## 2021-04-14 ENCOUNTER — Other Ambulatory Visit (HOSPITAL_COMMUNITY)
Admission: RE | Admit: 2021-04-14 | Discharge: 2021-04-14 | Disposition: A | Discharge status: Home / Self Care | Payer: BC Managed Care – PPO | Source: Ambulatory Visit | Attending: Obstetrics and Gynecology | Admitting: Obstetrics and Gynecology

## 2021-04-14 ENCOUNTER — Ambulatory Visit (INDEPENDENT_AMBULATORY_CARE_PROVIDER_SITE_OTHER): Payer: 59 | Admitting: Obstetrics and Gynecology

## 2021-04-14 VITALS — BP 122/82 | Ht 68.0 in | Wt 143.4 lb

## 2021-04-14 DIAGNOSIS — Z01419 Encounter for gynecological examination (general) (routine) without abnormal findings: Secondary | ICD-10-CM | POA: Diagnosis not present

## 2021-04-14 DIAGNOSIS — Z124 Encounter for screening for malignant neoplasm of cervix: Secondary | ICD-10-CM | POA: Diagnosis not present

## 2021-04-14 DIAGNOSIS — A6004 Herpesviral vulvovaginitis: Secondary | ICD-10-CM | POA: Diagnosis not present

## 2021-04-14 DIAGNOSIS — R69 Illness, unspecified: Secondary | ICD-10-CM | POA: Diagnosis not present

## 2021-04-14 MED ORDER — VALACYCLOVIR HCL 500 MG PO TABS: 500.0000 mg | ORAL_TABLET | Freq: Every day | ORAL | 3 refills | Status: DC

## 2021-04-14 NOTE — Progress Notes (Signed)
? ?PCP:  Patient, No Pcp Per (Inactive) ? ? ?Chief Complaint  ?Patient presents with  ? Annual Exam  ? ? ?HPI: ?     [name] is a 26 y.o. G0P0000 who LMP was Patient's last menstrual period was 04/01/2021 (exact date)., presents today for her annual examination.  Her menses are regular every 28-30 days, lasting 5-7 days.  Dysmenorrhea mild, occurring first 1-2 days of flow. She does not have intermenstrual bleeding. ? ?Sex activity: was recently sex but not now--contraception condoms. Declines STD testing today. 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 No recent outbreaks.  ? ?There is a FH of breast cancer in her MGM, genetic testing not indicated. There is no FH of ovarian cancer. The patient does not do self-breast exams. ? ?Tobacco use: The patient denies current or previous tobacco use. ?Alcohol use: none ?No drug use.  ?Exercise: very active ? ?She does get adequate calcium and Vitamin D in her diet. ? ?Gardasil series not done and pt not interested.  ? ? ?Past Medical History:  ?Diagnosis Date  ? Herpes genitalis 10/2013  ? type 2 by culture  ? ? ?Past Surgical History:  ?Procedure Laterality Date  ? NO PAST SURGERIES    ? ? ?Family History  ?Problem Relation Age of Onset  ? Breast cancer Maternal Grandmother 13  ? Cancer Neg Hx   ? Diabetes Neg Hx   ? Hyperlipidemia Neg Hx   ? Hypertension Neg Hx   ? Stroke Neg Hx   ? Thyroid disease Neg Hx   ? ? ?Social History  ? ?Socioeconomic History  ? Marital status: Single  ?  Spouse name: Not on file  ? Number of children: Not on file  ? Years of education: Not on file  ? Highest education level: Not on file  ?Occupational History  ? Not on file  ?Tobacco Use  ? Smoking status: Never  ? Smokeless tobacco: Never  ?Vaping Use  ? Vaping Use: Never used  ?Substance and Sexual Activity  ? Alcohol use: No  ? Drug use: No  ? Sexual activity: Yes  ?  Birth control/protection: Condom  ?Other  Topics Concern  ? Not on file  ?Social History Narrative  ? Not on file  ? ?Social Determinants of Health  ? ?Financial Resource Strain: Not on file  ?Food Insecurity: Not on file  ?Transportation Needs: Not on file  ?Physical Activity: Not on file  ?Stress: Not on file  ?Social Connections: Not on file  ?Intimate Partner Violence: Not on file  ? ? ?Current Meds  ?Medication Sig  ? [DISCONTINUED] valACYclovir (VALTREX) 500 MG tablet Take 1 tablet (500 mg total) by mouth daily.  ? ? ? ?ROS: ? ?Review of Systems  ?Constitutional:  Negative for fatigue, fever and unexpected weight change.  ?Respiratory:  Negative for cough, shortness of breath and wheezing.   ?Cardiovascular:  Negative for chest pain, palpitations and leg swelling.  ?Gastrointestinal:  Negative for blood in stool, constipation, diarrhea, nausea and vomiting.  ?Endocrine: Negative for cold intolerance, heat intolerance and polyuria.  ?Genitourinary:  Negative for dyspareunia, dysuria, flank pain, frequency, genital sores, hematuria, menstrual problem, pelvic pain, urgency, vaginal bleeding, vaginal discharge and vaginal pain.  ?Musculoskeletal:  Negative for back pain, joint swelling and myalgias.  ?Skin:  Negative for rash.  ?Neurological:  Negative for dizziness, syncope, light-headedness, numbness and headaches.  ?Hematological:  Negative  for adenopathy.  ?Psychiatric/Behavioral:  Negative for agitation, confusion, sleep disturbance and suicidal ideas. The patient is not nervous/anxious.   ? ? ?Objective: ?BP 122/82   Ht 5\' 8"  (1.727 m)   Wt 143 lb 6.4 oz (65 kg)   LMP 04/01/2021 (Exact Date)   BMI 21.80 kg/m?  ? ? ?Physical Exam ?Constitutional:   ?   Appearance: She is well-developed.  ?Genitourinary:  ?   Vulva normal.  ?   Right Labia: No rash, tenderness or lesions. ?   Left Labia: No tenderness, lesions or rash. ?   No vaginal discharge, erythema, tenderness or bleeding.  ? ?   Right Adnexa: not tender and no mass present. ?   Left Adnexa:  not tender and no mass present. ?   No cervical motion tenderness, discharge, friability or polyp.  ?   Uterus is not enlarged or tender.  ?Breasts: ?   Right: No mass, nipple discharge, skin change or tenderness.  ?   Left: No mass, nipple discharge, skin change or tenderness.  ?Neck:  ?   Thyroid: No thyromegaly.  ?Cardiovascular:  ?   Rate and Rhythm: Normal rate and regular rhythm.  ?   Heart sounds: Normal heart sounds. No murmur heard. ?Pulmonary:  ?   Effort: Pulmonary effort is normal.  ?   Breath sounds: Normal breath sounds.  ?Abdominal:  ?   Palpations: Abdomen is soft.  ?   Tenderness: There is no abdominal tenderness. There is no guarding or rebound.  ?Musculoskeletal:     ?   General: Normal range of motion.  ?   Cervical back: Normal range of motion.  ?Lymphadenopathy:  ?   Cervical: No cervical adenopathy.  ?Neurological:  ?   General: No focal deficit present.  ?   Mental Status: She is alert and oriented to person, place, and time.  ?   Cranial Nerves: No cranial nerve deficit.  ?Skin: ?   General: Skin is warm and Sanders.  ?Psychiatric:     ?   Mood and Affect: Mood normal.     ?   Behavior: Behavior normal.     ?   Thought Content: Thought content normal.     ?   Judgment: Judgment normal.  ?Vitals and nursing note reviewed.  ? ? ?Assessment/Plan: ?Encounter for annual routine gynecological examination ? ?Cervical cancer screening - Plan: Cytology - PAP ? ?Herpes simplex vulvovaginitis - Plan: valACYclovir (VALTREX) 500 MG tablet; Rx RF ? ?Meds ordered this encounter  ?Medications  ? valACYclovir (VALTREX) 500 MG tablet  ?  Sig: Take 1 tablet (500 mg total) by mouth daily.  ?  Dispense:  90 tablet  ?  Refill:  3  ?  Order Specific Question:   Supervising Provider  ?  AnswerGae Sanders [id number]  ? ? ?GYN counsel adequate intake of calcium and vitamin D, diet and exercise ? ? ?  F/U ? Return in about 1 year (around 04/15/2022). ? ?Rachael Sanders B. Felipe Cabell, PA-C ?04/14/2021 ?10:37 AM ?

## 2021-04-14 NOTE — Patient Instructions (Signed)
I value your feedback and you entrusting us with your care. If you get a [facility] patient survey, I would appreciate you taking the time to let us know about your experience today. Thank you! ? ? ?

## 2021-04-17 LAB — CYTOLOGY - PAP
Comment: NEGATIVE
Diagnosis: UNDETERMINED — AB
High risk HPV: NEGATIVE

## 2021-10-03 DIAGNOSIS — M25551 Pain in right hip: Secondary | ICD-10-CM | POA: Diagnosis not present

## 2021-10-06 DIAGNOSIS — M25551 Pain in right hip: Secondary | ICD-10-CM | POA: Diagnosis not present

## 2022-01-06 DIAGNOSIS — R519 Headache, unspecified: Secondary | ICD-10-CM | POA: Diagnosis not present

## 2022-02-10 ENCOUNTER — Telehealth: Payer: 59 | Admitting: Physician Assistant

## 2022-02-10 DIAGNOSIS — J020 Streptococcal pharyngitis: Secondary | ICD-10-CM

## 2022-02-10 MED ORDER — PREDNISONE 20 MG PO TABS: 40.0000 mg | ORAL_TABLET | Freq: Every day | ORAL | 0 refills | Status: DC

## 2022-02-10 NOTE — Patient Instructions (Signed)
  Rod Holler, thank you for joining Leeanne Rio, PA-C for today's virtual visit.  While this provider is not your primary care provider (PCP), if your PCP is located in our provider database this encounter information will be shared with them immediately following your visit.   [facility] account gives you access to today's visit and all your visits, tests, and labs performed at [facility] " click here if you don't have a [facility] account or go to [email or web address]  Consent: (Patient) Rod Holler provided verbal consent for this virtual visit at the beginning of the encounter.  Current Medications:  Current Outpatient Medications:    valACYclovir (VALTREX) 500 MG tablet, Take 1 tablet (500 mg total) by mouth daily., Disp: 90 tablet, Rfl: 3   Medications ordered in this encounter:  No orders of the defined types were placed in this encounter.    *If you need refills on other medications prior to your next appointment, please contact your pharmacy*  Follow-Up: Call back or seek an in-person evaluation if the symptoms worsen or if the condition fails to improve as anticipated.  [place] [phone]  Other Instructions Stop the Tamiflu. Continue the Amoxicillin as directed.  Take with food and a daily probiotic.  Take the prednisone as directed. Start a salt-water gargle. Ok to continue OTC Tylenol.   If you have been instructed to have an in-person evaluation today at a local Urgent Care facility, please use the link below. It will take you to a list of all of our available [facility], including address, phone number and hours of operation. Please do not delay care.  [facility]  If you or a family member do not have a primary care provider, use the link below to schedule a visit and establish care. When you choose a [facility] primary care physician or advanced  practice provider, you gain a long-term partner in health. Find a Primary Care Provider  Learn more about [facility] in-office and virtual care options: [facility]

## 2022-02-10 NOTE — Progress Notes (Signed)
Virtual Visit Consent   Rachael Sanders, you are scheduled for a virtual visit with a [facility] provider today. Just as with appointments in the office, your consent must be obtained to participate. Your consent will be active for this visit and any virtual visit you may have with one of our providers in the next 365 days. If you have a MyChart account, a copy of this consent can be sent to you electronically.  As this is a virtual visit, video technology does not allow for your provider to perform a traditional examination. This may limit your provider's ability to fully assess your condition. If your provider identifies any concerns that need to be evaluated in person or the need to arrange testing (such as labs, EKG, etc.), we will make arrangements to do so. Although advances in technology are sophisticated, we cannot ensure that it will always work on either your end or our end. If the connection with a video visit is poor, the visit may have to be switched to a telephone visit. With either a video or telephone visit, we are not always able to ensure that we have a secure connection.  By engaging in this virtual visit, you consent to the provision of healthcare and authorize for your insurance to be billed (if applicable) for the services provided during this visit. Depending on your insurance coverage, you may receive a charge related to this service.  I need to obtain your verbal consent now. Are you willing to proceed with your visit today? Rachael Sanders has provided verbal consent on 02/10/2022 for a virtual visit (video or telephone). Leeanne Rio, [place]  Date: 02/10/2022 1:46 PM  Virtual Visit via Video Note   I, Leeanne Rio, connected with  Rachael Sanders  ([id number], [birth year]) on 02/10/22 at  1:45 PM EST by a video-enabled telemedicine application and verified that I am speaking with the correct person using two  identifiers.  Location: Patient: Virtual Visit Location Patient: Home Provider: Virtual Visit Location Provider: Home Office   I discussed the limitations of evaluation and management by telemedicine and the availability of in person appointments. The patient expressed understanding and agreed to proceed.    History of Present Illness: Rachael Sanders is a 27 y.o. who identifies as a female who was assigned female at birth, and is being seen today for follow-up of an outside virtual visit yesterday when she was started on treatment for possible flu and strep. Notes symptoms starting Sunday night into Monday morning with chills, fever, sore throat and white spots on back of her throat. Did video visit and was given Tamiflu and Amoxicillin. Denies other URI symptoms. Today chills are improved as is fever. Noting increased tonsillar swelling which makes it very painful to swallow. Noting more white patches on tonsils. Has started both the Amoxicillin and Tamiflu. Is wondering what she should actually take for current symptoms. Has taken home COVID tests which were negative.   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  Resting comfortably at home.  Head is normocephalic, atraumatic.  No labored breathing. Speech is clear and coherent with logical content.  Patient is alert and oriented at baseline.  Bilateral tonsillar enlargement 2+ with exudate noted. Uvula midline and without lesion. Significant posterior oropharyngeal  erythema noted.  Assessment and Plan: 1. Strep pharyngitis  Classic symptoms. Bilateral tonsillar exudate. Doubt flu. Feel she can stop the Tamiflu given. Continue Amoxicillin as directed by prior provider. Take with food and start a daily probiotic. Supportive measures, OTC medications reviewed. Will give 3 day burst of prednisone for tonsillar swelling and throat pain. Follow-up in person if not resolving.   Follow Up Instructions: I discussed the assessment and treatment plan with the patient. The patient was provided an opportunity to ask questions and all were answered. The patient agreed with the plan and demonstrated an understanding of the instructions.  A copy of instructions were sent to the patient via MyChart unless otherwise noted below.   The patient was advised to call back or seek an in-person evaluation if the symptoms worsen or if the condition fails to improve as anticipated.  Time:  I spent 10 minutes with the patient via telehealth technology discussing the above problems/concerns.    Leeanne Rio, PA-C

## 2022-04-15 NOTE — Progress Notes (Signed)
PCP:  Patient, No Pcp Per   Chief Complaint  Patient presents with   Gynecologic Exam    No concerns    HPI:      [name] is a 27 y.o. G0P0000 who LMP was Patient's last menstrual period was 03/21/2022 (approximate)., presents today for her annual examination.  Her menses are regular every 28-30 days, lasting 5-7 days, light to mod flow.  Dysmenorrhea mild, occurring first 1-2 days of flow. She does not have intermenstrual bleeding.  Sex activity: in past but not now--contraception condoms. . 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 No recent outbreaks.   There is a FH of breast cancer in her MGM, genetic testing not indicated. There is no FH of ovarian cancer. The patient does not do self-breast exams.  Tobacco use: The patient denies current or previous tobacco use. Alcohol use: none No drug use.  Exercise: mod active  She does get adequate calcium but not Vitamin D in her diet.  Gardasil series not done and pt not interested.    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    Past Surgical History:  Procedure Laterality Date   NO PAST SURGERIES      Family History  Problem Relation Age of Onset   Breast cancer Maternal Grandmother 65   Cancer Neg Hx    Diabetes Neg Hx    Hyperlipidemia Neg Hx    Hypertension Neg Hx    Stroke Neg Hx    Thyroid disease Neg Hx     Social History   Socioeconomic History   Marital status: Single    Spouse name: Not on file   Number of children: Not on file   Years of education: Not on file   Highest education level: Not on file  Occupational History   Not on file  Tobacco Use   Smoking status: Never   Smokeless tobacco: Never  Vaping Use   Vaping Use: Never used  Substance and Sexual Activity   Alcohol use: No   Drug use: No   Sexual activity: Not Currently    Birth  control/protection: None  Other Topics Concern   Not on file  Social History Narrative   Not on file   Social Determinants of Health   Financial Resource Strain: Not on file  Food Insecurity: Not on file  Transportation Needs: Not on file  Physical Activity: Not on file  Stress: Not on file  Social Connections: Not on file  Intimate Partner Violence: Not on file    Current Meds  Medication Sig   [DISCONTINUED] valACYclovir (VALTREX) 500 MG tablet Take 1 tablet (500 mg total) by mouth daily.     ROS:  Review of Systems  Constitutional:  Negative for fatigue, fever and unexpected weight change.  Respiratory:  Negative for cough, shortness of breath and wheezing.   Cardiovascular:  Negative for chest pain, palpitations and leg swelling.  Gastrointestinal:  Negative for blood in stool, constipation, diarrhea, nausea and vomiting.  Endocrine: Negative for cold intolerance, heat intolerance and polyuria.  Genitourinary:  Negative for dyspareunia, dysuria, flank pain, frequency, genital sores, hematuria, menstrual problem, pelvic pain, urgency, vaginal bleeding, vaginal discharge and vaginal pain.  Musculoskeletal:  Negative for back pain, joint swelling and myalgias.  Skin:  Negative for rash.  Neurological:  Negative for dizziness, syncope,  light-headedness, numbness and headaches.  Hematological:  Negative for adenopathy.  Psychiatric/Behavioral:  Negative for agitation, confusion, sleep disturbance and suicidal ideas. The patient is not nervous/anxious.      Objective: BP 100/70   Ht 5\' 8"  (1.727 m)   Wt 147 lb (66.7 kg)   LMP 03/21/2022 (Approximate)   BMI 22.35 kg/m    Physical Exam Constitutional:      Appearance: She is well-developed.  Genitourinary:     Vulva normal.     Right Labia: No rash, tenderness or lesions.    Left Labia: No tenderness, lesions or rash.    No vaginal discharge, erythema, tenderness or bleeding.      Right Adnexa: not tender and no  mass present.    Left Adnexa: not tender and no mass present.    No cervical motion tenderness, discharge, friability or polyp.     Uterus is not enlarged or tender.  Breasts:    Right: No mass, nipple discharge, skin change or tenderness.     Left: No mass, nipple discharge, skin change or tenderness.  Neck:     Thyroid: No thyromegaly.  Cardiovascular:     Rate and Rhythm: Normal rate and regular rhythm.     Heart sounds: Normal heart sounds. No murmur heard. Pulmonary:     Effort: Pulmonary effort is normal.     Breath sounds: Normal breath sounds.  Abdominal:     Palpations: Abdomen is soft.     Tenderness: There is no abdominal tenderness. There is no guarding or rebound.  Musculoskeletal:        General: Normal range of motion.     Cervical back: Normal range of motion.  Lymphadenopathy:     Cervical: No cervical adenopathy.  Neurological:     General: No focal deficit present.     Mental Status: She is alert and oriented to person, place, and time.     Cranial Nerves: No cranial nerve deficit.  Skin:    General: Skin is warm and dry.  Psychiatric:        Mood and Affect: Mood normal.        Behavior: Behavior normal.        Thought Content: Thought content normal.        Judgment: Judgment normal.  Vitals and nursing note reviewed.     Assessment/Plan: Encounter for annual routine gynecological examination  Cervical cancer screening - Plan: Cytology - PAP  ASCUS of cervix with negative high risk HPV - Plan: Cytology - PAP; repeat pap today.   Herpes simplex vulvovaginitis - Plan: valACYclovir (VALTREX) 500 MG tablet; Rx RF.    Meds ordered this encounter  Medications   valACYclovir (VALTREX) 500 MG tablet    Sig: Take 1 tablet (500 mg total) by mouth daily.    Dispense:  90 tablet    Refill:  3    Order Specific Question:   Supervising Provider    Answer:   Waymon Budge    GYN counsel adequate intake of calcium and vitamin D, diet and  exercise     F/U  Return in about 1 year (around 04/16/2023).  Clenton Esper B. Zuri Lascala, PA-C 04/16/2022 10:42 AM

## 2022-04-16 ENCOUNTER — Ambulatory Visit (INDEPENDENT_AMBULATORY_CARE_PROVIDER_SITE_OTHER): Payer: 59 | Admitting: Obstetrics and Gynecology

## 2022-04-16 ENCOUNTER — Encounter: Payer: Self-pay | Admitting: Obstetrics and Gynecology

## 2022-04-16 ENCOUNTER — Other Ambulatory Visit (HOSPITAL_COMMUNITY)
Admission: RE | Admit: 2022-04-16 | Discharge: 2022-04-16 | Disposition: A | Discharge status: Home / Self Care | Payer: 59 | Source: Ambulatory Visit | Attending: Obstetrics and Gynecology | Admitting: Obstetrics and Gynecology

## 2022-04-16 VITALS — BP 100/70 | Ht 68.0 in | Wt 147.0 lb

## 2022-04-16 DIAGNOSIS — Z01419 Encounter for gynecological examination (general) (routine) without abnormal findings: Secondary | ICD-10-CM | POA: Diagnosis not present

## 2022-04-16 DIAGNOSIS — Z124 Encounter for screening for malignant neoplasm of cervix: Secondary | ICD-10-CM | POA: Insufficient documentation

## 2022-04-16 DIAGNOSIS — R87610 Atypical squamous cells of undetermined significance on cytologic smear of cervix (ASC-US): Secondary | ICD-10-CM | POA: Diagnosis not present

## 2022-04-16 DIAGNOSIS — A6004 Herpesviral vulvovaginitis: Secondary | ICD-10-CM

## 2022-04-16 MED ORDER — VALACYCLOVIR HCL 500 MG PO TABS: 500.0000 mg | ORAL_TABLET | Freq: Every day | ORAL | 3 refills | Status: DC

## 2022-04-16 NOTE — Patient Instructions (Signed)
I value your feedback and you entrusting us with your care. If you get a [facility] patient survey, I would appreciate you taking the time to let us know about your experience today. Thank you! ? ? ?

## 2022-04-22 LAB — CYTOLOGY - PAP
Diagnosis: NEGATIVE
Diagnosis: REACTIVE

## 2022-06-11 ENCOUNTER — Other Ambulatory Visit (HOSPITAL_COMMUNITY)
Admission: RE | Admit: 2022-06-11 | Discharge: 2022-06-11 | Disposition: A | Discharge status: Home / Self Care | Payer: 59 | Source: Ambulatory Visit | Attending: Obstetrics and Gynecology | Admitting: Obstetrics and Gynecology

## 2022-06-11 ENCOUNTER — Ambulatory Visit (INDEPENDENT_AMBULATORY_CARE_PROVIDER_SITE_OTHER): Payer: 59

## 2022-06-11 VITALS — BP 124/77 | HR 56 | Resp 16 | Ht 68.0 in | Wt 148.5 lb

## 2022-06-11 DIAGNOSIS — N898 Other specified noninflammatory disorders of vagina: Secondary | ICD-10-CM | POA: Insufficient documentation

## 2022-06-11 NOTE — Patient Instructions (Signed)

## 2022-06-11 NOTE — Progress Notes (Signed)
    NURSE VISIT NOTE  Subjective:    Patient ID: Rachael Sanders, female    DOB: [birth year], 28 y.o.   MRN: [id number]  HPI  Patient is a 27 y.o. G0P0000 female who presents for white and thick vaginal discharge for 1 week(s). Denies abnormal vaginal bleeding or significant pelvic pain or fever. denies dysuria, hematuria, urinary frequency, and urinary urgency. Patient has history of known exposure to STD.   Objective:    BP 124/77   Pulse (!) 56   Resp 16   Ht 5\' 8"  (1.727 m)   Wt 148 lb 8 oz (67.4 kg)   BMI 22.58 kg/m    @THIS  VISIT ONLY@  Assessment:   1. Vaginal discharge     bacterial vaginosis  Plan:   GC and chlamydia DNA  probe sent to lab. Treatment: will begin when results are back. PhD samples given to patient ROV prn if symptoms persist or worsen.   Santiago Bumpers, CMA [facility]

## 2022-06-15 ENCOUNTER — Other Ambulatory Visit: Payer: Self-pay

## 2022-06-15 DIAGNOSIS — B379 Candidiasis, unspecified: Secondary | ICD-10-CM

## 2022-06-15 DIAGNOSIS — B9689 Other specified bacterial agents as the cause of diseases classified elsewhere: Secondary | ICD-10-CM

## 2022-06-15 LAB — CERVICOVAGINAL ANCILLARY ONLY
Bacterial Vaginitis (gardnerella): POSITIVE — AB
Candida Glabrata: NEGATIVE
Candida Vaginitis: POSITIVE — AB
Chlamydia: NEGATIVE
Comment: NEGATIVE
Comment: NEGATIVE
Comment: NEGATIVE
Comment: NEGATIVE
Comment: NEGATIVE
Comment: NORMAL
Neisseria Gonorrhea: NEGATIVE
Trichomonas: NEGATIVE

## 2022-06-15 MED ORDER — METRONIDAZOLE 500 MG PO TABS: 500.0000 mg | ORAL_TABLET | Freq: Two times a day (BID) | ORAL | 0 refills | Status: DC

## 2022-06-15 MED ORDER — FLUCONAZOLE 150 MG PO TABS: 150.0000 mg | ORAL_TABLET | Freq: Once | ORAL | 0 refills | Status: AC

## 2022-10-26 ENCOUNTER — Ambulatory Visit (INDEPENDENT_AMBULATORY_CARE_PROVIDER_SITE_OTHER): Payer: 59 | Admitting: Obstetrics and Gynecology

## 2022-10-26 ENCOUNTER — Ambulatory Visit: Payer: 59 | Admitting: Obstetrics and Gynecology

## 2022-10-26 ENCOUNTER — Encounter: Payer: Self-pay | Admitting: Obstetrics and Gynecology

## 2022-10-26 VITALS — BP 112/64 | Ht 68.0 in | Wt 151.0 lb

## 2022-10-26 DIAGNOSIS — Z30011 Encounter for initial prescription of contraceptive pills: Secondary | ICD-10-CM

## 2022-10-26 MED ORDER — MICROGESTIN 24 FE 1-20 MG-MCG PO TABS
1.0000 | ORAL_TABLET | Freq: Every day | ORAL | 2 refills | Status: DC
Start: 2022-10-26 — End: 2023-02-15

## 2022-10-26 NOTE — Progress Notes (Signed)
Patient, No Pcp Per   Chief Complaint  Patient presents with   Contraception    Possibly BC pills    HPI:      [name] is a 27 y.o. G0P0000 whose LMP was Patient's last menstrual period was 10/26/2022 (exact date)., presents today for [facility] consult. Never done [facility] before, getting married 3/25. Not currently sexually active. No hx of HTN, DVTs, migraines with aura. Menses are monthly, lasting 5-7 days. Would like OCPs.  Annual due 3/25. Takes valtrex daily for HSV prevention.    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    Past Surgical History:  Procedure Laterality Date   NO PAST SURGERIES      Family History  Problem Relation Age of Onset   Breast cancer Maternal Grandmother 24   Cancer Neg Hx    Diabetes Neg Hx    Hyperlipidemia Neg Hx    Hypertension Neg Hx    Stroke Neg Hx    Thyroid disease Neg Hx     Social History   Socioeconomic History   Marital status: Single    Spouse name: Not on file   Number of children: Not on file   Years of education: Not on file   Highest education level: Not on file  Occupational History   Not on file  Tobacco Use   Smoking status: Never   Smokeless tobacco: Never  Vaping Use   Vaping status: Never Used  Substance and Sexual Activity   Alcohol use: No   Drug use: No   Sexual activity: Not Currently    Birth control/protection: None  Other Topics Concern   Not on file  Social History Narrative   Not on file   Social Determinants of Health   Financial Resource Strain: Not on file  Food Insecurity: Not on file  Transportation Needs: Not on file  Physical Activity: Not on file  Stress: Not on file  Social Connections: Not on file  Intimate Partner Violence: Not on file    Outpatient Medications Prior to Visit  Medication Sig Dispense Refill   valACYclovir (VALTREX) 500 MG tablet Take 1 tablet (500 mg total) by mouth daily. 90 tablet 3    metroNIDAZOLE (FLAGYL) 500 MG tablet Take 1 tablet (500 mg total) by mouth 2 (two) times daily. 14 tablet 0   No facility-administered medications prior to visit.      ROS:  Review of Systems  Constitutional:  Negative for fever.  Gastrointestinal:  Negative for blood in stool, constipation, diarrhea, nausea and vomiting.  Genitourinary:  Negative for dyspareunia, dysuria, flank pain, frequency, hematuria, urgency, vaginal bleeding, vaginal discharge and vaginal pain.  Musculoskeletal:  Negative for back pain.  Skin:  Negative for rash.   BREAST: No symptoms   OBJECTIVE:   Vitals:  BP 112/64   Ht 5\' 8"  (1.727 m)   Wt 151 lb (68.5 kg)   LMP 10/26/2022 (Exact Date)   BMI 22.96 kg/m   Physical Exam Vitals reviewed.  Constitutional:      Appearance: She is well-developed.  Pulmonary:     Effort: Pulmonary effort is normal.  Musculoskeletal:        General: Normal range of motion.     Cervical back: Normal range of motion.  Skin:    General: Skin is warm and dry.  Neurological:     General: No focal deficit present.     Mental Status: She  is alert and oriented to person, place, and time.     Cranial Nerves: No cranial nerve deficit.  Psychiatric:        Mood and Affect: Mood normal.        Behavior: Behavior normal.        Thought Content: Thought content normal.        Judgment: Judgment normal.     Assessment/Plan: Encounter for initial prescription of contraceptive pills - Plan: Norethindrone Acetate-Ethinyl Estrad-FE (MICROGESTIN 24 FE) 1-20 MG-MCG(24) tablet; BC options discussed, pt would like OCPs. Rx eRxd. Start this wk with menses. F/u prn.   Meds ordered this encounter  Medications   Norethindrone Acetate-Ethinyl Estrad-FE (MICROGESTIN 24 FE) 1-20 MG-MCG(24) tablet    Sig: Take 1 tablet by mouth daily.    Dispense:  84 tablet    Refill:  2    Order Specific Question:   Supervising Provider    Answer:   Hildred Laser [[id number]]      Return if  symptoms worsen or fail to improve.  Della Scrivener B. Andry Bogden, PA-C 10/26/2022 11:33 AM

## 2022-10-26 NOTE — Patient Instructions (Signed)
I value your feedback and you entrusting us with your care. If you get a [facility] patient survey, I would appreciate you taking the time to let us know about your experience today. Thank you! ? ? ?

## 2022-11-17 ENCOUNTER — Encounter: Payer: Self-pay | Admitting: Obstetrics and Gynecology

## 2023-02-14 ENCOUNTER — Encounter: Payer: Self-pay | Admitting: Obstetrics and Gynecology

## 2023-02-15 ENCOUNTER — Other Ambulatory Visit: Payer: Self-pay | Admitting: Obstetrics and Gynecology

## 2023-02-15 MED ORDER — DROSPIRENONE-ETHINYL ESTRADIOL 3-0.02 MG PO TABS: 1.0000 | ORAL_TABLET | Freq: Every day | ORAL | 0 refills | Status: DC

## 2023-02-15 NOTE — Progress Notes (Signed)
 Rx yaz due to [facility] on [address]. Annual due 3/25.

## 2023-04-17 ENCOUNTER — Other Ambulatory Visit: Payer: Self-pay | Admitting: Obstetrics and Gynecology

## 2023-04-17 DIAGNOSIS — A6004 Herpesviral vulvovaginitis: Secondary | ICD-10-CM

## 2023-04-19 ENCOUNTER — Encounter: Payer: Self-pay | Admitting: Obstetrics and Gynecology

## 2023-04-19 DIAGNOSIS — A6004 Herpesviral vulvovaginitis: Secondary | ICD-10-CM

## 2023-04-19 MED ORDER — VALACYCLOVIR HCL 500 MG PO TABS: 500.0000 mg | ORAL_TABLET | Freq: Every day | ORAL | 0 refills | Status: DC

## 2023-05-06 ENCOUNTER — Ambulatory Visit (INDEPENDENT_AMBULATORY_CARE_PROVIDER_SITE_OTHER): Admitting: Obstetrics and Gynecology

## 2023-05-06 ENCOUNTER — Encounter: Payer: Self-pay | Admitting: Obstetrics and Gynecology

## 2023-05-06 VITALS — BP 113/75 | HR 64 | Ht 68.0 in | Wt 148.0 lb

## 2023-05-06 DIAGNOSIS — Z3041 Encounter for surveillance of contraceptive pills: Secondary | ICD-10-CM

## 2023-05-06 DIAGNOSIS — A6004 Herpesviral vulvovaginitis: Secondary | ICD-10-CM

## 2023-05-06 DIAGNOSIS — Z01419 Encounter for gynecological examination (general) (routine) without abnormal findings: Secondary | ICD-10-CM

## 2023-05-06 MED ORDER — VALACYCLOVIR HCL 500 MG PO TABS: 500.0000 mg | ORAL_TABLET | Freq: Every day | ORAL | 3 refills | Status: AC

## 2023-05-06 MED ORDER — DROSPIRENONE-ETHINYL ESTRADIOL 3-0.02 MG PO TABS: 1.0000 | ORAL_TABLET | Freq: Every day | ORAL | 3 refills | Status: AC

## 2023-05-06 NOTE — Progress Notes (Signed)
 PCP:  Patient, No Pcp Per   Chief Complaint  Patient presents with   Gynecologic Exam    No concerns    HPI:      [name] is a 28 y.o. G0P0000 who LMP was Patient's last menstrual period was 04/24/2023 (approximate)., presents today for her annual examination.  Her menses are regular every 28-30 days, lasting 3-5 days on OCPs, light to mod flow.  Dysmenorrhea mild, occurring first 1-2 days of flow. She does not have intermenstrual bleeding.  Sex activity: currently sexually active--contraception OCPs. No pain/bleeding/dryness. 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 No recent outbreaks.   There is a FH of breast cancer in her MGM, genetic testing not indicated. There is no FH of ovarian cancer. The patient does not do self-breast exams.  Tobacco use: The patient denies current or previous tobacco use. Alcohol use: none No drug use.  Exercise: mod active  She does get adequate calcium but not Vitamin D in her diet.  Gardasil series not done and pt not interested.    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    Past Surgical History:  Procedure Laterality Date   NO PAST SURGERIES      Family History  Problem Relation Age of Onset   Breast cancer Maternal Grandmother 75   Cancer Neg Hx    Diabetes Neg Hx    Hyperlipidemia Neg Hx    Hypertension Neg Hx    Stroke Neg Hx    Thyroid disease Neg Hx     Social History   Socioeconomic History   Marital status: Single    Spouse name: Not on file   Number of children: Not on file   Years of education: Not on file   Highest education level: Not on file  Occupational History   Not on file  Tobacco Use   Smoking status: Never   Smokeless tobacco: Never  Vaping Use   Vaping status: Never Used  Substance and Sexual Activity   Alcohol use: No   Drug use: No   Sexual  activity: Yes    Birth control/protection: Pill  Other Topics Concern   Not on file  Social History Narrative   Not on file   Social Drivers of Health   Financial Resource Strain: Not on file  Food Insecurity: Not on file  Transportation Needs: Not on file  Physical Activity: Not on file  Stress: Not on file  Social Connections: Not on file  Intimate Partner Violence: Not on file    Current Meds  Medication Sig   [DISCONTINUED] drospirenone-ethinyl estradiol (YAZ) 3-0.02 MG tablet Take 1 tablet by mouth daily.   [DISCONTINUED] valACYclovir (VALTREX) 500 MG tablet Take 1 tablet (500 mg total) by mouth daily.     ROS:  Review of Systems  Constitutional:  Negative for fatigue, fever and unexpected weight change.  Respiratory:  Negative for cough, shortness of breath and wheezing.   Cardiovascular:  Negative for chest pain, palpitations and leg swelling.  Gastrointestinal:  Negative for blood in stool, constipation, diarrhea, nausea and vomiting.  Endocrine: Negative for cold intolerance, heat intolerance and polyuria.  Genitourinary:  Negative for dyspareunia, dysuria, flank pain, frequency, genital sores, hematuria, menstrual problem, pelvic pain, urgency, vaginal bleeding, vaginal discharge and vaginal pain.  Musculoskeletal:  Negative for back pain, joint  swelling and myalgias.  Skin:  Negative for rash.  Neurological:  Negative for dizziness, syncope, light-headedness, numbness and headaches.  Hematological:  Negative for adenopathy.  Psychiatric/Behavioral:  Negative for agitation, confusion, sleep disturbance and suicidal ideas. The patient is not nervous/anxious.      Objective: BP 113/75   Pulse 64   Ht 5\' 8"  (1.727 m)   Wt 148 lb (67.1 kg)   LMP 04/24/2023 (Approximate)   BMI 22.50 kg/m    Physical Exam Constitutional:      Appearance: She is well-developed.  Genitourinary:     Vulva normal.     Right Labia: No rash, tenderness or lesions.    Left  Labia: No tenderness, lesions or rash.    No vaginal discharge, erythema, tenderness or bleeding.      Right Adnexa: not tender and no mass present.    Left Adnexa: not tender and no mass present.    No cervical motion tenderness, discharge, friability or polyp.     Uterus is not enlarged or tender.  Breasts:    Right: No mass, nipple discharge, skin change or tenderness.     Left: No mass, nipple discharge, skin change or tenderness.  Neck:     Thyroid: No thyromegaly.  Cardiovascular:     Rate and Rhythm: Normal rate and regular rhythm.     Heart sounds: Normal heart sounds. No murmur heard. Pulmonary:     Effort: Pulmonary effort is normal.     Breath sounds: Normal breath sounds.  Abdominal:     Palpations: Abdomen is soft.     Tenderness: There is no abdominal tenderness. There is no guarding or rebound.  Musculoskeletal:        General: Normal range of motion.     Cervical back: Normal range of motion.  Lymphadenopathy:     Cervical: No cervical adenopathy.  Neurological:     General: No focal deficit present.     Mental Status: She is alert and oriented to person, place, and time.     Cranial Nerves: No cranial nerve deficit.  Skin:    General: Skin is warm and dry.  Psychiatric:        Mood and Affect: Mood normal.        Behavior: Behavior normal.        Thought Content: Thought content normal.        Judgment: Judgment normal.  Vitals and nursing note reviewed.     Assessment/Plan: Encounter for annual routine gynecological examination  Encounter for surveillance of contraceptive pills - Plan: drospirenone-ethinyl estradiol (YAZ) 3-0.02 MG tablet; OCP RF eRxd.   Herpes simplex vulvovaginitis - Plan: valACYclovir (VALTREX) 500 MG tablet; Rx RF eRxd.    Meds ordered this encounter  Medications   drospirenone-ethinyl estradiol (YAZ) 3-0.02 MG tablet    Sig: Take 1 tablet by mouth daily.    Dispense:  84 tablet    Refill:  3    Supervising Provider:    Hildred Laser [[id number]]   valACYclovir (VALTREX) 500 MG tablet    Sig: Take 1 tablet (500 mg total) by mouth daily.    Dispense:  90 tablet    Refill:  3    Supervising Provider:   Waymon Budge    GYN counsel adequate intake of calcium and vitamin D, diet and exercise     F/U  Return in about 1 year (around 05/05/2024).  Sriansh Farra B. Audrina Marten, PA-C 05/06/2023 2:17 PM

## 2023-05-06 NOTE — Patient Instructions (Signed)
 I value your feedback and you entrusting [place] with your care. If you get a [facility] patient survey, I would appreciate you taking the time to let us know about your experience today. Thank you! ? ? ?

## 2023-12-14 ENCOUNTER — Encounter: Payer: Self-pay | Admitting: Obstetrics and Gynecology
